# Patient Record
Sex: Male | Born: 1960 | Race: White | Hispanic: No | Marital: Married | Smoking: Former smoker
Health system: Southern US, Community
[De-identification: ages and names within clinical notes are randomized; demographics above are authoritative.]

## PROBLEM LIST (undated history)

## (undated) DIAGNOSIS — F1910 Other psychoactive substance abuse, uncomplicated: Secondary | ICD-10-CM

## (undated) DIAGNOSIS — F419 Anxiety disorder, unspecified: Secondary | ICD-10-CM

## (undated) DIAGNOSIS — K469 Unspecified abdominal hernia without obstruction or gangrene: Secondary | ICD-10-CM

## (undated) DIAGNOSIS — M1990 Unspecified osteoarthritis, unspecified site: Secondary | ICD-10-CM

## (undated) HISTORY — DX: Other psychoactive substance abuse, uncomplicated: F19.10

## (undated) HISTORY — PX: IMPLANTATION BONE ANCHORED HEARING AID: SUR691

## (undated) HISTORY — DX: Unspecified osteoarthritis, unspecified site: M19.90

## (undated) HISTORY — DX: Anxiety disorder, unspecified: F41.9

## (undated) HISTORY — DX: Unspecified abdominal hernia without obstruction or gangrene: K46.9

## (undated) HISTORY — PX: HERNIA REPAIR: SHX51

---

## 1999-06-27 ENCOUNTER — Encounter: Admission: RE | Admit: 1999-06-27 | Discharge: 1999-06-27 | Payer: Self-pay | Admitting: Internal Medicine

## 1999-06-27 ENCOUNTER — Encounter: Payer: Self-pay | Admitting: Internal Medicine

## 2002-12-06 ENCOUNTER — Encounter: Payer: Self-pay | Admitting: Internal Medicine

## 2002-12-06 ENCOUNTER — Ambulatory Visit (HOSPITAL_COMMUNITY): Admission: RE | Admit: 2002-12-06 | Discharge: 2002-12-06 | Payer: Self-pay | Admitting: Internal Medicine

## 2004-01-16 ENCOUNTER — Ambulatory Visit (HOSPITAL_COMMUNITY): Admission: RE | Admit: 2004-01-16 | Discharge: 2004-01-16 | Payer: Self-pay | Admitting: Internal Medicine

## 2005-01-09 ENCOUNTER — Ambulatory Visit (HOSPITAL_COMMUNITY): Admission: RE | Admit: 2005-01-09 | Discharge: 2005-01-09 | Payer: Self-pay | Admitting: Internal Medicine

## 2005-12-27 ENCOUNTER — Encounter: Admission: RE | Admit: 2005-12-27 | Discharge: 2005-12-27 | Payer: Self-pay | Admitting: Otolaryngology

## 2008-03-29 ENCOUNTER — Ambulatory Visit (HOSPITAL_COMMUNITY): Admission: RE | Admit: 2008-03-29 | Discharge: 2008-03-29 | Payer: Self-pay | Admitting: Internal Medicine

## 2010-06-07 ENCOUNTER — Ambulatory Visit (HOSPITAL_COMMUNITY)
Admission: RE | Admit: 2010-06-07 | Discharge: 2010-06-07 | Disposition: A | Discharge status: Home / Self Care | Payer: BC Managed Care – PPO | Source: Ambulatory Visit | Attending: Internal Medicine | Admitting: Internal Medicine

## 2010-06-07 ENCOUNTER — Other Ambulatory Visit (HOSPITAL_COMMUNITY): Payer: Self-pay | Admitting: Internal Medicine

## 2010-06-07 DIAGNOSIS — I10 Essential (primary) hypertension: Secondary | ICD-10-CM | POA: Insufficient documentation

## 2010-06-07 DIAGNOSIS — Z0000 Encounter for general adult medical examination without abnormal findings: Secondary | ICD-10-CM | POA: Insufficient documentation

## 2010-11-12 ENCOUNTER — Encounter (INDEPENDENT_AMBULATORY_CARE_PROVIDER_SITE_OTHER): Payer: Self-pay | Admitting: General Surgery

## 2010-11-12 ENCOUNTER — Ambulatory Visit (INDEPENDENT_AMBULATORY_CARE_PROVIDER_SITE_OTHER): Payer: BC Managed Care – PPO | Admitting: General Surgery

## 2010-11-12 VITALS — BP 134/86 | HR 72 | Temp 96.8°F | Ht 71.0 in | Wt 199.4 lb

## 2010-11-12 DIAGNOSIS — K4090 Unilateral inguinal hernia, without obstruction or gangrene, not specified as recurrent: Secondary | ICD-10-CM

## 2010-11-12 NOTE — Progress Notes (Signed)
Kevin Irwin is a 50 y.o. male.    Chief Complaint  Patient presents with  . Other    new pt- eval RIH    HPI HPI This patient was referred for evaluation of a right groin bulge with associated pain. He has history of a left inguinal hernia repair with mesh performed proximally 15-16 years ago and has done well at this but he reports similar symptoms that he had associated with his left-sided hernia now on his right. He states that he has noticed a bulge with some associated "dull" pain the last 3-4 months although the pain has resolved and is now no longer present. He does have occasional discomfort when lifting heavy objects. 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 He does state that this limits him somewhat with his work. Otherwise he denies any obstructive symptoms states his bowels are normal. Denies any nausea vomiting, hematochezia or melena. He has been referred already by his primary physician for routine screening colonoscopy.  Past Medical History  Diagnosis Date  . Hernia   . Arthritis     Past Surgical History  Procedure Date  . Hernia repair     Family History  Problem Relation Age of Onset  . Cancer Father     Social History History  Substance Use Topics  . Smoking status: Former Games developer  . Smokeless tobacco: Not on file  . Alcohol Use: 0.0 oz/week    7-10 Cans of beer per week    No Known Allergies  Current Outpatient Prescriptions  Medication Sig Dispense Refill  . TESTIM 50 MG/5GM GEL Ad lib.        Review of Systems Review of Systems  Constitutional: Negative.   HENT: Negative.   Eyes: Negative.   Respiratory: Negative.   Cardiovascular: Negative.   Gastrointestinal: Negative.   Genitourinary: Negative.   Musculoskeletal: Negative.   Skin: Negative.   Neurological: Negative.   Endo/Heme/Allergies: Negative.   Psychiatric/Behavioral: Negative.     Physical Exam Physical Exam  Constitutional: He is oriented to  person, place, and time. He appears well-developed and well-nourished. No distress.  HENT:  Head: Normocephalic and atraumatic.  Mouth/Throat: No oropharyngeal exudate.  Eyes: Conjunctivae and EOM are normal. Pupils are equal, round, and reactive to light. Right eye exhibits no discharge. Left eye exhibits no discharge. No scleral icterus.  Neck: Normal range of motion. Neck supple. No tracheal deviation present.  Cardiovascular: Normal rate, regular rhythm, normal heart sounds and intact distal pulses.   Respiratory: Effort normal and breath sounds normal. No stridor. No respiratory distress. He has no wheezes.  GI: Soft. Bowel sounds are normal. He exhibits no distension and no mass. There is no tenderness. There is no rebound and no guarding.  Genitourinary: Penis normal. No penile tenderness.       Prior LIH scar, no evidence of recurrence RIght groin bulge with valsalva consistent with RIH, reducible   Musculoskeletal: Normal range of motion. He exhibits no edema and no tenderness.  Neurological: He is alert and oriented to person, place, and time.  Skin: Skin is warm and dry. No rash noted. He is not diaphoretic. No erythema. No pallor.  Psychiatric: He has a normal mood and affect. His behavior is normal. Judgment and thought content normal.     Blood pressure 134/86, pulse 72, temperature 96.8 F (36 C), height 5\' 11"  (1.803 m), weight 199 lb 6.4 oz (90.447 kg).  Assessment/Plan Reducible right inguinal hernia  He  does have a reducible right inguinal hernia on exam and by history. I discussed with him the options of open and laparoscopic repair versus watchful waiting and he would like to have this repaired. Given the fact that he has had an open left inguinal hernia already I think that open right inguinal hernia repair would be appropriate. If he has recurrence of either side in the future we will reserve the laparoscopic route. I discussed with him the risks of the procedure  including infection, bleeding, pain, chronic pain and nerve injury, numbness, injury to the vas deferens or loss of testicle, and recurrence and he expressed understanding and desires to proceed with open right inguinal hernia repair with mesh.  Lodema Pilot DAVID 11/12/2010, 10:04 AM

## 2010-11-22 ENCOUNTER — Encounter (HOSPITAL_COMMUNITY)
Admission: RE | Admit: 2010-11-22 | Discharge: 2010-11-22 | Disposition: A | Discharge status: Home / Self Care | Payer: BC Managed Care – PPO | Source: Ambulatory Visit | Attending: General Surgery | Admitting: General Surgery

## 2010-11-22 LAB — BASIC METABOLIC PANEL
BUN: 12 mg/dL (ref 6–23)
Chloride: 104 mEq/L (ref 96–112)
Glucose, Bld: 86 mg/dL (ref 70–99)
Potassium: 4.2 mEq/L (ref 3.5–5.1)

## 2010-11-22 LAB — CBC
HCT: 42.5 % (ref 39.0–52.0)
Hemoglobin: 14.6 g/dL (ref 13.0–17.0)
MCHC: 34.4 g/dL (ref 30.0–36.0)
MCV: 87.3 fL (ref 78.0–100.0)
WBC: 7.8 10*3/uL (ref 4.0–10.5)

## 2010-12-03 ENCOUNTER — Ambulatory Visit (HOSPITAL_COMMUNITY)
Admission: RE | Admit: 2010-12-03 | Discharge: 2010-12-03 | Disposition: A | Discharge status: Home / Self Care | Payer: BC Managed Care – PPO | Source: Ambulatory Visit | Attending: General Surgery | Admitting: General Surgery

## 2010-12-03 DIAGNOSIS — K4090 Unilateral inguinal hernia, without obstruction or gangrene, not specified as recurrent: Secondary | ICD-10-CM

## 2010-12-03 DIAGNOSIS — Z01812 Encounter for preprocedural laboratory examination: Secondary | ICD-10-CM | POA: Insufficient documentation

## 2010-12-18 ENCOUNTER — Ambulatory Visit (INDEPENDENT_AMBULATORY_CARE_PROVIDER_SITE_OTHER): Payer: BC Managed Care – PPO | Admitting: General Surgery

## 2010-12-18 VITALS — BP 126/82 | HR 66

## 2010-12-18 DIAGNOSIS — Z4889 Encounter for other specified surgical aftercare: Secondary | ICD-10-CM

## 2010-12-18 DIAGNOSIS — Z5189 Encounter for other specified aftercare: Secondary | ICD-10-CM

## 2010-12-18 NOTE — Progress Notes (Signed)
Subjective:     Patient ID: Kevin Irwin, male   DOB: 11-01-1960, 50 y.o.   MRN: [id number]  HPI This patient is 2 weeks status post open right inguinal hernia repair with mesh. He states he is doing well as the longer taking pain medication. He is tolerating regular diet and his bowels are functioning well now after some initial constipation. He has occasional twinge in the right groin but otherwise pain improved.  Review of Systems     Objective:   Physical Exam No acute distress and nontoxic-appearing  His incision is healing well without sign of infection. He has a healing ridge but no evidence of recurrence, no bulge with Valsalva. Scrotum and testicles are normal.    Assessment:     Status post open right ankle hernia repair with mesh-overall doing well    Plan:     He seems to be doing well and appropriate for this stage postop. I recommended another 2 weeks of light duty before he returned to full activity. He can shower as normal and in another 2 weeks he can increase his activity as tolerated. If he continues to have discomfort in the region in another 2-3 months then I recommended that he give me a call for further evaluation.

## 2010-12-25 NOTE — Op Note (Signed)
NAME:  Kevin Irwin, Kevin Irwin NO.:  [id number]  MEDICAL RECORD NO.:  [id number]  LOCATION:  [facility]                         FACILITY:  [facility]  PHYSICIAN:  Lodema Pilot, MD       DATE OF BIRTH:  [birth year]  DATE OF PROCEDURE:  12/03/2010 DATE OF DISCHARGE:                              OPERATIVE REPORT   PROCEDURE:  Open right inguinal hernia repair with mesh.  PREOPERATIVE DIAGNOSIS:  Right inguinal hernia.  POSTOPERATIVE DIAGNOSIS:  Right inguinal hernia.  SURGEON:  Lodema Pilot, MD  ASSISTANT:  Anselm Pancoast. Zachery Dakins, MD  ANESTHESIA:  General LMA anesthesia with 30 mL of 1% lidocaine with epinephrine and 0.25% Marcaine in a 50:50 mixture.  FLUIDS:  Crystalloid 500 mL.  ESTIMATED BLOOD LOSS:  Minimal.  DRAINS:  None.  SPECIMENS:  None.  COMPLICATIONS:  None apparent.  FINDINGS:  Indirect and direct inguinal hernia as well as a cord lipoma, and a 3 inch x 6 inch UltraPro mesh placed in the inguinal canal.  INDICATIONS FOR PROCEDURE:  Kevin Irwin is a 50 year old male with a history of left inguinal hernia repair who presents with a new symptomatic right inguinal hernia on exam and desires repair.  OPERATIVE DETAILS:  Kevin Irwin was seen and evaluated in the preoperative area and risks and benefits of the procedure were again discussed in lay terms.  Informed consent was obtained.  The patient was examined and noted to have a reducible right inguinal hernia, and no left inguinal hernia was found on exam.  Prophylactic antibiotics were given.  He was taken to the operating room, placed on table in supine position.  His scrotum and abdomen were prepped and draped in standard surgical fashion.  An oblique incision was made in the skin and dissection carried down to subcutaneous tissue using Bovie electrocautery and the external oblique fascia was sharply incised along the length of the fibers.  The spermatic cord was immediately visualized and the ilioinguinal  nerve was identified as well coursing along the anterior surface of the cord structures.  The small direct hernia was noted and this was easily reduced back into the abdominal cavity and the floor was imbricated with a 2-0 Vicryl suture.  The cord was then skeletonized and he was noted to have cord lipoma laterally to the cord and this was taken down to its origin and ligation performed using a 2-0 Vicryl and stick tie.  The cord was further skeletonized and he was noted to have an indirect inguinal hernia as well, but appeared to only contain fatty contents, this was very small and reduced easily back into the peritoneum.  The internal ring was tightened using figure-of-eight 2- 0 Vicryl suture and the inguinal canal was inspected for hemostasis which was noted be adequate.  An 3 inch x 6 inch UltraPro mesh was tailored to fit the inguinal canal and sutured in place at the pubic tubercle and a 2-0 Prolene suture was used to run the inferior edge of the mesh and attachment to the shelving edge of the inguinal ligament. This was placed in the lateral aspect of the mesh and the tail of the mesh were passed around the cord  and was sutured in place medially and cephalad and laterally with interrupted 2-0 Prolene sutures, taking care to avoid injury to visible nervous structures.  The ilioinguinal nerve was preserved during the dissection and maintained with the cord structures.  The mesh was sutured in place and appeared to cover both indirect and direct hernia defects and the wound was then irrigated with sterile saline solution and the wound was noted be hemostatic.  The fascia was injected with a total of 30 mL of 1% lidocaine with epinephrine and 0.25% Marcaine in a 50:50 mixture.  Again, the wound was noted to be hemostatic.  The external oblique fascia was approximated with 2-0 Vicryl running suture to recreate the external ring, and Scarpa fascia was approximated with a 3-0 Vicryl  running suture, and the skin edge were approximated with 4-0 Monocryl subcuticular suture.  Skin was washed and dried and Dermabond was applied.  All sponge, needle, and instrument counts were correct at the end of the case.  The patient tolerated the procedure well without apparent complication.          ______________________________ Lodema Pilot, MD     BL/MEDQ  D:  12/03/2010  T:  12/04/2010  Job:  [id number]  Electronically Signed by Lodema Pilot DO on 12/25/2010 03:55:08 PM

## 2012-12-31 ENCOUNTER — Other Ambulatory Visit: Payer: Self-pay | Admitting: Sports Medicine

## 2012-12-31 DIAGNOSIS — IMO0002 Reserved for concepts with insufficient information to code with codable children: Secondary | ICD-10-CM

## 2013-01-01 ENCOUNTER — Ambulatory Visit
Admission: RE | Admit: 2013-01-01 | Discharge: 2013-01-01 | Disposition: A | Discharge status: Home / Self Care | Payer: BC Managed Care – PPO | Source: Ambulatory Visit | Attending: Sports Medicine | Admitting: Sports Medicine

## 2013-01-01 DIAGNOSIS — IMO0002 Reserved for concepts with insufficient information to code with codable children: Secondary | ICD-10-CM

## 2013-05-14 ENCOUNTER — Encounter (HOSPITAL_COMMUNITY)
Admission: AD | Disposition: A | Discharge status: Home / Self Care | Payer: Self-pay | Source: Ambulatory Visit | Attending: Gastroenterology

## 2013-05-14 ENCOUNTER — Ambulatory Visit (HOSPITAL_COMMUNITY)
Admission: AD | Admit: 2013-05-14 | Discharge: 2013-05-14 | Disposition: A | Discharge status: Home / Self Care | Payer: BC Managed Care – PPO | Source: Ambulatory Visit | Attending: Gastroenterology | Admitting: Gastroenterology

## 2013-05-14 ENCOUNTER — Other Ambulatory Visit: Payer: Self-pay | Admitting: Gastroenterology

## 2013-05-14 ENCOUNTER — Encounter (HOSPITAL_COMMUNITY): Payer: Self-pay

## 2013-05-14 DIAGNOSIS — T18108A Unspecified foreign body in esophagus causing other injury, initial encounter: Secondary | ICD-10-CM | POA: Insufficient documentation

## 2013-05-14 DIAGNOSIS — IMO0002 Reserved for concepts with insufficient information to code with codable children: Secondary | ICD-10-CM | POA: Insufficient documentation

## 2013-05-14 HISTORY — PX: ESOPHAGOGASTRODUODENOSCOPY: SHX5428

## 2013-05-14 SURGERY — EGD (ESOPHAGOGASTRODUODENOSCOPY)
Anesthesia: Moderate Sedation

## 2013-05-14 MED ORDER — SODIUM CHLORIDE 0.9 % IV SOLN
INTRAVENOUS | Status: DC
  Administered 2013-05-14: 500 mL via INTRAVENOUS

## 2013-05-14 MED ORDER — DIPHENHYDRAMINE HCL 50 MG/ML IJ SOLN
INTRAMUSCULAR | Status: AC
  Filled 2013-05-14: qty 1

## 2013-05-14 MED ORDER — FENTANYL CITRATE 0.05 MG/ML IJ SOLN
INTRAMUSCULAR | Status: AC
  Filled 2013-05-14: qty 2

## 2013-05-14 MED ORDER — FENTANYL CITRATE 0.05 MG/ML IJ SOLN
INTRAMUSCULAR | Status: DC | PRN
  Administered 2013-05-14 (×3): 25 ug via INTRAVENOUS

## 2013-05-14 MED ORDER — MIDAZOLAM HCL 5 MG/ML IJ SOLN
INTRAMUSCULAR | Status: AC
  Filled 2013-05-14: qty 2

## 2013-05-14 MED ORDER — BUTAMBEN-TETRACAINE-BENZOCAINE 2-2-14 % EX AERO
INHALATION_SPRAY | CUTANEOUS | Status: DC | PRN
  Administered 2013-05-14: 2 via TOPICAL

## 2013-05-14 MED ORDER — MIDAZOLAM HCL 10 MG/2ML IJ SOLN
INTRAMUSCULAR | Status: DC | PRN
  Administered 2013-05-14: 1 mg via INTRAVENOUS
  Administered 2013-05-14 (×2): 2 mg via INTRAVENOUS
  Administered 2013-05-14: 1 mg via INTRAVENOUS

## 2013-05-14 NOTE — Discharge Instructions (Signed)
Liquids only today and if doing well may have soft solids tomorrow and slowly advance diet and chew food well and eat slowly and drink plenty of liquids while eating and use over-the-counter Prilosec every day and call if question or problem or continue in swallowing issues otherwise followup with myself or Dr. Madilyn FiremanHayes in 2-3 weeks to discuss possible dilation and probable screening colonoscopy

## 2013-05-14 NOTE — Op Note (Signed)
[facility] [address] [place], [id number]   ENDOSCOPY PROCEDURE REPORT  PATIENT: Kevin Irwin, [name]  MR#: [id number] BIRTHDATE: [birth year] , 52  yrs. old GENDER: Male  ENDOSCOPIST: Vida RiggerMarc Robbie Nangle, MD REFERRED BY:  PROCEDURE DATE:  05/14/2013 PROCEDURE:   EGD w/ fb removal ASA CLASS:   Class II INDICATIONS:Foreign body removal.  MEDICATIONS: Fentanyl 75 mcg IV and Versed 6 mg IV  TOPICAL ANESTHETIC:used  DESCRIPTION OF PROCEDURE:   After the risks benefits and alternatives of the procedure were thoroughly explained, informed consent was obtained.  The Pentax Gastroscope [id number]  endoscope was introduced through the mouth and advanced to thedistal esophagus were obvious food was seen and we advanced the snare and it cut through the food x2 in the food well in the stomach and then the scope was advanced to the second portion of the duodenum , limited by Without limitations.   The instrument was slowly withdrawn as the mucosa was fully examined.the findings are recorded below and the patient tolerated the procedure well there was no obvious immediate complication         FINDINGS:#1 obvious food bolus status post cut with snare x2 and fell into stomach 2. Small hiatal hernia with widely patent fibrous ring 3 otherwise within normal limits EGD  COMPLICATIONS: no  ENDOSCOPIC IMPRESSION:above   RECOMMENDATIONS:liquids only today pump inhibitors OTC Prilosec will be okay and followup in the office in 2-3 weeks to discuss possible dilation and probable screening colonoscopy and call sooner when necessaryand chew food well and take his time and drink plenty of fluids   REPEAT EXAM: as needed   _______________________________ Vida RiggerMarc Hurshell Dino, MD eSigned:  Vida RiggerMarc Pierrette Scheu, MD 05/14/2013 4:39 PM    CC:  PATIENT NAME:  Kevin Irwin, Treyven MR#: [id number]

## 2013-05-14 NOTE — Addendum Note (Signed)
Addended byVida Rigger: Kirstyn Lean on: 05/14/2013 03:14 PM   Modules accepted: Orders

## 2013-05-17 ENCOUNTER — Encounter (HOSPITAL_COMMUNITY): Payer: Self-pay | Admitting: Gastroenterology

## 2014-07-04 ENCOUNTER — Ambulatory Visit (INDEPENDENT_AMBULATORY_CARE_PROVIDER_SITE_OTHER): Payer: BLUE CROSS/BLUE SHIELD | Admitting: Physician Assistant

## 2014-07-04 VITALS — BP 118/76 | HR 89 | Temp 97.8°F | Resp 12 | Ht 69.75 in | Wt 202.0 lb

## 2014-07-04 DIAGNOSIS — F1020 Alcohol dependence, uncomplicated: Secondary | ICD-10-CM

## 2014-07-04 NOTE — Progress Notes (Addendum)
Subjective:    Patient ID: Kevin Irwin, male    DOB: [birth year], 54 y.o.   MRN: [id number]  Chief Complaint  Patient presents with  . alcoholism    would like to talk about    There are no active problems to display for this patient.  Prior to Admission medications   Not on File   Medications, allergies, past medical history, surgical history, family history, social history and problem list reviewed and updated.  HPI  39 yom with no significant known pmh presents wanting to discuss alcohol cessation options.  States he started drinking in teens. Felt that he had a prob by 54 years old. Quit drinking in early 30s for few yrs cold [place]. Started back up as he was bored with not drinking. Three DUIs in his 1s. Now mostly drinks at home. Two domestic violence charges. 1st 18 yrs ago, 2nd 6 yrs ago.   Still legally married but has been living alone for several yrs. Has two kids living with their mom. Works full time as Teaching laboratory technician. Does not drinks before work or on the job.   Drinks aprrox 7-8 beers day. 8% alcohol. No liquor, wine.   Quit smoking 20 yrs ago cold [place].   Drinks to attain the feeling of creativity. Does not feel it otherwise. Does not know when to stop.   On zoloft many yrs ago for depression. Didn't like it. Doesn't think he is anxious or depressed. Just gets bored and desires the creative adge that alcohol gives him.   Review of Systems No cp, sob, fever, chills.     Objective:   Physical Exam  Constitutional: He is oriented to person, place, and time. He appears well-developed and well-nourished.  Non-toxic appearance. He does not have a sickly appearance. He does not appear ill. No distress.  BP 118/76 mmHg  Pulse 89  Temp(Src) 97.8 F (36.6 C) (Oral)  Resp 12  Ht 5' 9.75" (1.772 m)  Wt 202 lb (91.627 kg)  BMI 29.18 kg/m2  SpO2 99%   Eyes: Conjunctivae and EOM are normal. Pupils are equal, round, and reactive to light. Right eye  exhibits no nystagmus. Left eye exhibits no nystagmus.  Cardiovascular: Normal rate, regular rhythm and normal heart sounds.   Abdominal: Soft. Normal appearance and bowel sounds are normal. He exhibits no pulsatile liver, no fluid wave and no ascites. There is no hepatosplenomegaly. There is no tenderness.  No spider angiomas.   Neurological: He is alert and oriented to person, place, and time.  No asterixis.   Skin: Skin is warm and dry. He is not diaphoretic.  No palmar erythema.   Psychiatric: He has a normal mood and affect. His speech is normal and behavior is normal.      Assessment & Plan:   34 yom with no significant known pmh presents wanting to discuss alcohol cessation options.  Alcoholism - Plan: COMPLETE METABOLIC PANEL WITH GFR, CBC, Gamma GT, Ambulatory referral to Psychology --referral to psychology, pt on board with this initial step --possible medication in future to assist with cessation pending psych eval --cbc, cmp, ggt --no concerning physical exam signs  Donnajean Lopes, PA-C Physician Assistant-Certified [facility]  07/05/2014 9:01 PM  Greater than 45 minutes spent with pt, discussing his current medical condition and possible treatment options for this.  I have participated in the care of this patient with the Advanced Practice Provider and agree with Diagnosis and  Plan as documented. He is not your typical alcoholic-medication such as vivtrol may help him stop alcohol for little while but will not address the emptiness and lack of energy and lack of  creativity without alcohol. This suggest an underlying dysthymia and social anxiety and if his psychological evaluation substantiates this we will proceed with medication. Robert P. Merla Richesoolittle, M.D.

## 2014-07-04 NOTE — Patient Instructions (Signed)
Thank you for coming in to see us today.  We have referred you to see a psychology group. They will be in contact with you to schedule an appointment. If you don't hear anything from them in 12 weeks please let us know. You can also call them to schedule your own appointment as well. If they feel that a medication is best for you, they will likely have you come back to see us. We drew labs today to check you liver and blood cells, I'll be in touch with you with those results.

## 2014-07-05 LAB — CBC
HCT: 44.5 % (ref 39.0–52.0)
HEMOGLOBIN: 15.1 g/dL (ref 13.0–17.0)
MCH: 31 pg (ref 26.0–34.0)
MCHC: 33.9 g/dL (ref 30.0–36.0)
MCV: 91.4 fL (ref 78.0–100.0)
MPV: 9.4 fL (ref 8.6–12.4)
PLATELETS: 249 10*3/uL (ref 150–400)
RBC: 4.87 MIL/uL (ref 4.22–5.81)
RDW: 14.5 % (ref 11.5–15.5)
WBC: 4.9 10*3/uL (ref 4.0–10.5)

## 2014-07-05 LAB — COMPLETE METABOLIC PANEL WITH GFR
ALK PHOS: 40 U/L (ref 39–117)
ALT: 23 U/L (ref 0–53)
AST: 23 U/L (ref 0–37)
Albumin: 4.6 g/dL (ref 3.5–5.2)
BILIRUBIN TOTAL: 0.5 mg/dL (ref 0.2–1.2)
BUN: 14 mg/dL (ref 6–23)
CO2: 28 meq/L (ref 19–32)
CREATININE: 0.87 mg/dL (ref 0.50–1.35)
Calcium: 8.9 mg/dL (ref 8.4–10.5)
Chloride: 103 mEq/L (ref 96–112)
GLUCOSE: 101 mg/dL — AB (ref 70–99)
Potassium: 4.4 mEq/L (ref 3.5–5.3)
SODIUM: 139 meq/L (ref 135–145)
Total Protein: 7.1 g/dL (ref 6.0–8.3)

## 2014-07-05 LAB — GAMMA GT: GGT: 36 U/L (ref 7–51)

## 2015-02-20 ENCOUNTER — Other Ambulatory Visit: Payer: Self-pay | Admitting: Occupational Medicine

## 2015-02-20 ENCOUNTER — Ambulatory Visit: Payer: Self-pay

## 2015-02-20 DIAGNOSIS — M25572 Pain in left ankle and joints of left foot: Secondary | ICD-10-CM

## 2015-06-09 ENCOUNTER — Other Ambulatory Visit: Payer: Self-pay | Admitting: Otolaryngology

## 2015-06-09 DIAGNOSIS — H9193 Unspecified hearing loss, bilateral: Secondary | ICD-10-CM

## 2015-06-09 DIAGNOSIS — H9312 Tinnitus, left ear: Secondary | ICD-10-CM

## 2015-06-18 ENCOUNTER — Other Ambulatory Visit: Payer: Self-pay

## 2015-06-20 ENCOUNTER — Other Ambulatory Visit: Payer: Self-pay

## 2015-08-16 ENCOUNTER — Encounter (HOSPITAL_COMMUNITY)
Admission: EM | Disposition: A | Discharge status: Home / Self Care | Payer: Self-pay | Source: Home / Self Care | Attending: Emergency Medicine

## 2015-08-16 ENCOUNTER — Emergency Department (HOSPITAL_COMMUNITY): Payer: Worker's Compensation | Admitting: Certified Registered Nurse Anesthetist

## 2015-08-16 ENCOUNTER — Encounter (HOSPITAL_COMMUNITY): Payer: Self-pay | Admitting: Vascular Surgery

## 2015-08-16 ENCOUNTER — Emergency Department (HOSPITAL_COMMUNITY): Payer: Worker's Compensation

## 2015-08-16 ENCOUNTER — Ambulatory Visit (HOSPITAL_COMMUNITY)
Admission: EM | Admit: 2015-08-16 | Discharge: 2015-08-16 | Disposition: A | Discharge status: Home / Self Care | Payer: Worker's Compensation | Attending: Emergency Medicine | Admitting: Emergency Medicine

## 2015-08-16 DIAGNOSIS — S62632B Displaced fracture of distal phalanx of right middle finger, initial encounter for open fracture: Secondary | ICD-10-CM | POA: Insufficient documentation

## 2015-08-16 DIAGNOSIS — S62636B Displaced fracture of distal phalanx of right little finger, initial encounter for open fracture: Secondary | ICD-10-CM | POA: Diagnosis present

## 2015-08-16 DIAGNOSIS — Z87891 Personal history of nicotine dependence: Secondary | ICD-10-CM | POA: Insufficient documentation

## 2015-08-16 DIAGNOSIS — Z23 Encounter for immunization: Secondary | ICD-10-CM | POA: Insufficient documentation

## 2015-08-16 DIAGNOSIS — W3189XA Contact with other specified machinery, initial encounter: Secondary | ICD-10-CM | POA: Insufficient documentation

## 2015-08-16 DIAGNOSIS — IMO0001 Reserved for inherently not codable concepts without codable children: Secondary | ICD-10-CM

## 2015-08-16 DIAGNOSIS — S62634B Displaced fracture of distal phalanx of right ring finger, initial encounter for open fracture: Secondary | ICD-10-CM | POA: Insufficient documentation

## 2015-08-16 HISTORY — PX: NERVE, TENDON AND ARTERY REPAIR: SHX5695

## 2015-08-16 LAB — CBC WITH DIFFERENTIAL/PLATELET
BASOS ABS: 0 10*3/uL (ref 0.0–0.1)
Basophils Relative: 0 %
Eosinophils Absolute: 0 10*3/uL (ref 0.0–0.7)
Eosinophils Relative: 0 %
HCT: 43.9 % (ref 39.0–52.0)
HEMOGLOBIN: 14.9 g/dL (ref 13.0–17.0)
LYMPHS ABS: 0.9 10*3/uL (ref 0.7–4.0)
LYMPHS PCT: 9 %
MCH: 30.7 pg (ref 26.0–34.0)
MCHC: 33.9 g/dL (ref 30.0–36.0)
MCV: 90.3 fL (ref 78.0–100.0)
Monocytes Absolute: 0.6 10*3/uL (ref 0.1–1.0)
Monocytes Relative: 6 %
NEUTROS ABS: 9.2 10*3/uL — AB (ref 1.7–7.7)
NEUTROS PCT: 85 %
PLATELETS: 288 10*3/uL (ref 150–400)
RBC: 4.86 MIL/uL (ref 4.22–5.81)
RDW: 13 % (ref 11.5–15.5)
WBC: 10.8 10*3/uL — AB (ref 4.0–10.5)

## 2015-08-16 LAB — BASIC METABOLIC PANEL
ANION GAP: 11 (ref 5–15)
BUN: 8 mg/dL (ref 6–20)
CHLORIDE: 103 mmol/L (ref 101–111)
CO2: 23 mmol/L (ref 22–32)
Calcium: 9.8 mg/dL (ref 8.9–10.3)
Creatinine, Ser: 0.97 mg/dL (ref 0.61–1.24)
GFR calc Af Amer: >60 mL/min (ref >60)
GLUCOSE: 112 mg/dL — AB (ref 65–99)
POTASSIUM: 5.1 mmol/L (ref 3.5–5.1)
Sodium: 137 mmol/L (ref 135–145)

## 2015-08-16 SURGERY — NERVE, TENDON AND ARTERY REPAIR
Anesthesia: General | Laterality: Right

## 2015-08-16 MED ORDER — PROPOFOL 10 MG/ML IV BOLUS
INTRAVENOUS | Status: DC | PRN
  Administered 2015-08-16: 200 mg via INTRAVENOUS

## 2015-08-16 MED ORDER — LACTATED RINGERS IV SOLN
INTRAVENOUS | Status: DC
  Administered 2015-08-16 (×2): via INTRAVENOUS

## 2015-08-16 MED ORDER — LIDOCAINE HCL (CARDIAC) 20 MG/ML IV SOLN
INTRAVENOUS | Status: DC | PRN
  Administered 2015-08-16: 60 mg via INTRAVENOUS

## 2015-08-16 MED ORDER — OXYCODONE HCL 5 MG PO TABS: 5.0000 mg | ORAL_TABLET | Freq: Once | ORAL | Status: DC | PRN

## 2015-08-16 MED ORDER — BUPIVACAINE HCL (PF) 0.5 % IJ SOLN
20.0000 mL | Freq: Once | INTRAMUSCULAR | Status: AC
  Administered 2015-08-16: 20 mL
  Filled 2015-08-16: qty 20

## 2015-08-16 MED ORDER — SODIUM CHLORIDE 0.9 % IV SOLN
INTRAVENOUS | Status: DC
Start: 2015-08-16 — End: 2015-08-16
  Administered 2015-08-16: 15:00:00 via INTRAVENOUS

## 2015-08-16 MED ORDER — HYDROMORPHONE HCL 1 MG/ML IJ SOLN: 0.2500 mg | INTRAMUSCULAR | Status: DC | PRN

## 2015-08-16 MED ORDER — CEFAZOLIN SODIUM 1-5 GM-% IV SOLN
1.0000 g | Freq: Once | INTRAVENOUS | Status: AC
  Administered 2015-08-16: 1 g via INTRAVENOUS
  Filled 2015-08-16: qty 50

## 2015-08-16 MED ORDER — MIDAZOLAM HCL 2 MG/2ML IJ SOLN
INTRAMUSCULAR | Status: AC
  Filled 2015-08-16: qty 2

## 2015-08-16 MED ORDER — CHLORHEXIDINE GLUCONATE 4 % EX LIQD: 60.0000 mL | Freq: Once | CUTANEOUS | Status: DC

## 2015-08-16 MED ORDER — MIDAZOLAM HCL 5 MG/5ML IJ SOLN
INTRAMUSCULAR | Status: DC | PRN
  Administered 2015-08-16: 2 mg via INTRAVENOUS

## 2015-08-16 MED ORDER — ACETAMINOPHEN 160 MG/5ML PO SOLN
325.0000 mg | ORAL | Status: DC | PRN
  Filled 2015-08-16: qty 20.3

## 2015-08-16 MED ORDER — CEPHALEXIN 500 MG PO CAPS: 500.0000 mg | ORAL_CAPSULE | Freq: Four times a day (QID) | ORAL | Status: AC

## 2015-08-16 MED ORDER — 0.9 % SODIUM CHLORIDE (POUR BTL) OPTIME
TOPICAL | Status: DC | PRN
  Administered 2015-08-16: 1000 mL

## 2015-08-16 MED ORDER — BUPIVACAINE HCL (PF) 0.25 % IJ SOLN
INTRAMUSCULAR | Status: DC | PRN
  Administered 2015-08-16: 8 mL

## 2015-08-16 MED ORDER — DOCUSATE SODIUM 100 MG PO CAPS: 100.0000 mg | ORAL_CAPSULE | Freq: Two times a day (BID) | ORAL | Status: AC

## 2015-08-16 MED ORDER — ONDANSETRON HCL 4 MG/2ML IJ SOLN
INTRAMUSCULAR | Status: DC | PRN
  Administered 2015-08-16: 4 mg via INTRAVENOUS

## 2015-08-16 MED ORDER — ACETAMINOPHEN 325 MG PO TABS: 325.0000 mg | ORAL_TABLET | ORAL | Status: DC | PRN

## 2015-08-16 MED ORDER — OXYCODONE-ACETAMINOPHEN 5-325 MG PO TABS: 1.0000 | ORAL_TABLET | ORAL | Status: AC | PRN

## 2015-08-16 MED ORDER — TETANUS-DIPHTH-ACELL PERTUSSIS 5-2.5-18.5 LF-MCG/0.5 IM SUSP
0.5000 mL | Freq: Once | INTRAMUSCULAR | Status: AC
  Administered 2015-08-16: 0.5 mL via INTRAMUSCULAR
  Filled 2015-08-16: qty 0.5

## 2015-08-16 MED ORDER — FENTANYL CITRATE (PF) 100 MCG/2ML IJ SOLN
INTRAMUSCULAR | Status: DC | PRN
  Administered 2015-08-16: 50 ug via INTRAVENOUS
  Administered 2015-08-16 (×2): 100 ug via INTRAVENOUS

## 2015-08-16 MED ORDER — LIDOCAINE HCL 2 % IJ SOLN
20.0000 mL | Freq: Once | INTRAMUSCULAR | Status: AC
Start: 2015-08-16 — End: 2015-08-16
  Administered 2015-08-16: 400 mg via INTRADERMAL
  Filled 2015-08-16: qty 20

## 2015-08-16 MED ORDER — OXYCODONE HCL 5 MG/5ML PO SOLN: 5.0000 mg | Freq: Once | ORAL | Status: DC | PRN

## 2015-08-16 MED ORDER — SUCCINYLCHOLINE CHLORIDE 20 MG/ML IJ SOLN
INTRAMUSCULAR | Status: DC | PRN
  Administered 2015-08-16: 50 mg via INTRAVENOUS

## 2015-08-16 MED ORDER — FENTANYL CITRATE (PF) 250 MCG/5ML IJ SOLN
INTRAMUSCULAR | Status: AC
  Filled 2015-08-16: qty 5

## 2015-08-16 MED ORDER — CEFAZOLIN SODIUM-DEXTROSE 2-4 GM/100ML-% IV SOLN: 2.0000 g | INTRAVENOUS | Status: DC

## 2015-08-16 MED ORDER — CEFAZOLIN SODIUM-DEXTROSE 2-4 GM/100ML-% IV SOLN
INTRAVENOUS | Status: AC
  Administered 2015-08-16: 2 g via INTRAVENOUS
  Filled 2015-08-16: qty 100

## 2015-08-16 SURGICAL SUPPLY — 52 items
BANDAGE ELASTIC 3 VELCRO ST LF (GAUZE/BANDAGES/DRESSINGS) IMPLANT
BANDAGE ELASTIC 4 VELCRO ST LF (GAUZE/BANDAGES/DRESSINGS) IMPLANT
BNDG CMPR 9X4 STRL LF SNTH (GAUZE/BANDAGES/DRESSINGS) ×1
BNDG COHESIVE 1X5 TAN STRL LF (GAUZE/BANDAGES/DRESSINGS) ×4 IMPLANT
BNDG CONFORM 2 STRL LF (GAUZE/BANDAGES/DRESSINGS) ×4 IMPLANT
BNDG ESMARK 4X9 LF (GAUZE/BANDAGES/DRESSINGS) ×3 IMPLANT
BNDG GAUZE ELAST 4 BULKY (GAUZE/BANDAGES/DRESSINGS) IMPLANT
CORDS BIPOLAR (ELECTRODE) ×5 IMPLANT
COVER SURGICAL LIGHT HANDLE (MISCELLANEOUS) ×3 IMPLANT
CUFF TOURNIQUET SINGLE 18IN (TOURNIQUET CUFF) ×3 IMPLANT
CUFF TOURNIQUET SINGLE 24IN (TOURNIQUET CUFF) IMPLANT
DRAPE OEC MINIVIEW 54X84 (DRAPES) ×2 IMPLANT
DRAPE SURG 17X23 STRL (DRAPES) ×3 IMPLANT
DRSG ADAPTIC 3X8 NADH LF (GAUZE/BANDAGES/DRESSINGS) ×2 IMPLANT
GAUZE SPONGE 2X2 8PLY STRL LF (GAUZE/BANDAGES/DRESSINGS) IMPLANT
GAUZE SPONGE 4X4 12PLY STRL (GAUZE/BANDAGES/DRESSINGS) IMPLANT
GAUZE XEROFORM 1X8 LF (GAUZE/BANDAGES/DRESSINGS) ×2 IMPLANT
GLOVE BIOGEL PI IND STRL 8.5 (GLOVE) ×1 IMPLANT
GLOVE BIOGEL PI INDICATOR 8.5 (GLOVE) ×2
GLOVE SURG ORTHO 8.0 STRL STRW (GLOVE) ×3 IMPLANT
GOWN STRL REUS W/ TWL LRG LVL3 (GOWN DISPOSABLE) ×2 IMPLANT
GOWN STRL REUS W/ TWL XL LVL3 (GOWN DISPOSABLE) ×1 IMPLANT
GOWN STRL REUS W/TWL LRG LVL3 (GOWN DISPOSABLE) ×6
GOWN STRL REUS W/TWL XL LVL3 (GOWN DISPOSABLE) ×3
KIT BASIN OR (CUSTOM PROCEDURE TRAY) ×3 IMPLANT
KIT ROOM TURNOVER OR (KITS) ×3 IMPLANT
KWIRE 1.1 (Wire) ×6 IMPLANT
MANIFOLD NEPTUNE II (INSTRUMENTS) ×3 IMPLANT
NDL HYPO 25GX1X1/2 BEV (NEEDLE) IMPLANT
NEEDLE HYPO 25GX1X1/2 BEV (NEEDLE) IMPLANT
NS IRRIG 1000ML POUR BTL (IV SOLUTION) ×3 IMPLANT
PACK ORTHO EXTREMITY (CUSTOM PROCEDURE TRAY) ×3 IMPLANT
PAD ARMBOARD 7.5X6 YLW CONV (MISCELLANEOUS) ×6 IMPLANT
PAD CAST 4YDX4 CTTN HI CHSV (CAST SUPPLIES) IMPLANT
PADDING CAST COTTON 4X4 STRL (CAST SUPPLIES)
SOAP 2 % CHG 4 OZ (WOUND CARE) ×3 IMPLANT
SPECIMEN JAR SMALL (MISCELLANEOUS) ×3 IMPLANT
SPONGE GAUZE 2X2 STER 10/PKG (GAUZE/BANDAGES/DRESSINGS)
SUCTION FRAZIER HANDLE 10FR (MISCELLANEOUS)
SUCTION TUBE FRAZIER 10FR DISP (MISCELLANEOUS) IMPLANT
SUT ETHIBOND 4 0 TF (SUTURE) ×2 IMPLANT
SUT MERSILENE 4 0 P 3 (SUTURE) IMPLANT
SUT PROLENE 4 0 PS 2 18 (SUTURE) ×8 IMPLANT
SUT VIC AB 2-0 CT1 27 (SUTURE)
SUT VIC AB 2-0 CT1 TAPERPNT 27 (SUTURE) IMPLANT
SYR CONTROL 10ML LL (SYRINGE) IMPLANT
TOWEL OR 17X24 6PK STRL BLUE (TOWEL DISPOSABLE) ×3 IMPLANT
TOWEL OR 17X26 10 PK STRL BLUE (TOWEL DISPOSABLE) ×3 IMPLANT
TUBE CONNECTING 12'X1/4 (SUCTIONS)
TUBE CONNECTING 12X1/4 (SUCTIONS) IMPLANT
UNDERPAD 30X30 INCONTINENT (UNDERPADS AND DIAPERS) ×3 IMPLANT
WATER STERILE IRR 1000ML POUR (IV SOLUTION) ×1 IMPLANT

## 2015-08-16 NOTE — ED Notes (Signed)
Had BAHA done on 3/21.

## 2015-08-16 NOTE — H&P (Signed)
Kevin Irwin is an 55 y.o. male.   Chief Complaint: RIGHT LONG/RING AND SMALL FINGER INJURIES HPI: Kevin Irwin is a 55 y.o. right-handed male who presents to the Emergency Department complaining of a right hand injury that occurred earlier today. Patient states his right 3rd, 4th, and 5th digits were crushed in valve machinery. He rinsed the affected area with saline solution before going to an Urgent Care; he was sent to the ED for further evaluation. He denies any other injuries at this time.   Past Medical History  Diagnosis Date  . Hernia   . Arthritis   . Anxiety   . Substance abuse     Past Surgical History  Procedure Laterality Date  . Hernia repair      twice  . Esophagogastroduodenoscopy N/A 05/14/2013    Procedure: ESOPHAGOGASTRODUODENOSCOPY (EGD);  Surgeon: Jeryl Columbia, MD;  Location: [facility] END[facility]COPY;  Service: Endoscopy;  Laterality: N/A;  . Implantation bone anchored hearing aid      Family History  Problem Relation Age of Onset  . Cancer Father    Social History:  reports that he quit smoking about 22 years ago. He does not have any smokeless tobacco history on file. He reports that he drinks alcohol. He reports that he does not use illicit drugs.  Allergies: No Known Allergies  Medications Prior to Admission  Medication Sig Dispense Refill  . ibuprofen (ADVIL,MOTRIN) 200 MG tablet Take 400 mg by mouth every 6 (six) hours as needed for mild pain.      Results for orders placed or performed during the hospital encounter of 08/16/15 (from the past 48 hour(s))  CBC with Differential/Platelet     Status: Abnormal   Collection Time: 08/16/15  2:07 PM  Result Value Ref Range   WBC 10.8 (H) 4.0 - 10.5 K/uL   RBC 4.86 4.22 - 5.81 MIL/uL   Hemoglobin 14.9 13.0 - 17.0 g/dL   HCT 43.9 39.0 - 52.0 %   MCV 90.3 78.0 - 100.0 fL   MCH 30.7 26.0 - 34.0 pg   MCHC 33.9 30.0 - 36.0 g/dL   RDW 13.0 11.5 - 15.5 %   Platelets 288 150 - 400 K/uL   Neutrophils Relative % 85 %    Neutro Abs 9.2 (H) 1.7 - 7.7 K/uL   Lymphocytes Relative 9 %   Lymphs Abs 0.9 0.7 - 4.0 K/uL   Monocytes Relative 6 %   Monocytes Absolute 0.6 0.1 - 1.0 K/uL   Eosinophils Relative 0 %   Eosinophils Absolute 0.0 0.0 - 0.7 K/uL   Basophils Relative 0 %   Basophils Absolute 0.0 0.0 - 0.1 K/uL  Basic metabolic panel     Status: Abnormal   Collection Time: 08/16/15  2:07 PM  Result Value Ref Range   Sodium 137 135 - 145 mmol/L   Potassium 5.1 3.5 - 5.1 mmol/L   Chloride 103 101 - 111 mmol/L   CO2 23 22 - 32 mmol/L   Glucose, Bld 112 (H) 65 - 99 mg/dL   BUN 8 6 - 20 mg/dL   Creatinine, Ser 0.97 0.61 - 1.24 mg/dL   Calcium 9.8 8.9 - 10.3 mg/dL   GFR calc non Af Amer >60 >60 mL/min   GFR calc Af Amer >60 >60 mL/min    Comment: (NOTE) The eGFR has been calculated using the CKD EPI equation. This calculation has not been validated in all clinical situations. eGFR's persistently <60 mL/min signify possible Chronic Kidney Disease.  Anion gap 11 5 - 15   Dg Hand Complete Right  08/16/2015  CLINICAL DATA:  Injury to the third, fourth and fifth fingers at work. Pain. Swelling. EXAM: RIGHT HAND - COMPLETE 3+ VIEW COMPARISON:  None. FINDINGS: Injury to the third finger involves the distal phalanx. There is an oblique displaced fracture with laceration of the soft tissues. Injury to the fourth finger appears to involve both the distal phalanx proximally and the middle phalanx distally at the DIP joint. These fractures are essentially nondisplaced. Soft tissue laceration is present. Injury to the fifth finger consists of fractures of the distal middle phalanx and proximal distal phalanx, centered at the DIP joint. Soft tissue laceration is also present. Proximal phalanges are intact throughout. The thumb and index finger are intact. No metacarpal injury. No visible radiopaque foreign body. IMPRESSION: Third, fourth and fifth digit injuries as described. Electronically Signed   By: Staci Righter M.D.    On: 08/16/2015 11:55    ROSNO RECENT ILLNESSES OR HOSPITALIZATIONS  Blood pressure 159/93, pulse 99, temperature 98 F (36.7 C), temperature source Oral, resp. rate 16, SpO2 99 %. Physical Exam  General Appearance:  Alert, cooperative, no distress, appears stated age  Head:  Normocephalic, without obvious abnormality, atraumatic  Eyes:  Pupils equal, conjunctiva/corneas clear,         Throat: Lips, mucosa, and tongue normal; teeth and gums normal  Neck: No visible masses     Lungs:   respirations unlabored  Chest Wall:  No tenderness or deformity  Heart:  Regular rate and rhythm,  Abdomen:   Soft, non-tender,         Extremities: RIGHT HAND IN BANDAGE, THUMB WARM WELL PERFUSED GOOD WRIST AND FOREARM ROTATION GOOD ELBOW MOTION  Pulses: 2+ and symmetric  Skin: Skin color, texture, turgor normal, no rashes or lesions     Neurologic: Normal    Assessment/Plan RIGHT LONG/RING/SMALL FINGER CRUSH INJURIES  RIGHT LONG/RING/SMALL FINGER OPEN DEBRIDEMENT AND REPAIR AS INDICATED  R/B/A DISCUSSED WITH PT IN OFFICE.  PT VOICED UNDERSTANDING OF PLAN CONSENT SIGNED DAY OF SURGERY PT SEEN AND EXAMINED PRIOR TO OPERATIVE PROCEDURE/DAY OF SURGERY SITE MARKED. QUESTIONS ANSWERED WILL GO HOME FOLLOWING SURGERY  WE ARE PLANNING SURGERY FOR YOUR UPPER EXTREMITY. THE RISKS AND BENEFITS OF SURGERY INCLUDE BUT NOT LIMITED TO BLEEDING INFECTION, DAMAGE TO NEARBY NERVES ARTERIES TENDONS, FAILURE OF SURGERY TO ACCOMPLISH ITS INTENDED GOALS, PERSISTENT SYMPTOMS AND NEED FOR FURTHER SURGICAL INTERVENTION. WITH THIS IN MIND WE WILL PROCEED. I HAVE DISCUSSED WITH THE PATIENT THE PRE AND POSTOPERATIVE REGIMEN AND THE DOS AND DON'TS. PT VOICED UNDERSTANDING AND INFORMED CONSENT SIGNED.  Linna Hoff 08/16/2015, 4:21 PM

## 2015-08-16 NOTE — Anesthesia Preprocedure Evaluation (Signed)
Anesthesia Evaluation  Patient identified by MRN, date of birth, ID band Patient awake    Reviewed: Allergy & Precautions, NPO status , Patient's Chart, lab work & pertinent test results  Airway Mallampati: II  TM Distance: >3 FB Neck ROM: Full    Dental  (+) Teeth Intact   Pulmonary neg shortness of breath, neg sleep apnea, neg COPD, neg recent URI, former smoker,    breath sounds clear to auscultation       Cardiovascular negative cardio ROS   Rhythm:Regular     Neuro/Psych PSYCHIATRIC DISORDERS Anxiety negative neurological ROS     GI/Hepatic negative GI ROS, Neg liver ROS,   Endo/Other  negative endocrine ROS  Renal/GU negative Renal ROS     Musculoskeletal   Abdominal   Peds  Hematology negative hematology ROS (+)   Anesthesia Other Findings   Reproductive/Obstetrics                             Anesthesia Physical Anesthesia Plan  ASA: II  Anesthesia Plan: General   Post-op Pain Management:    Induction: Intravenous  Airway Management Planned: Oral ETT  Additional Equipment: None  Intra-op Plan:   Post-operative Plan: Extubation in OR  Informed Consent: I have reviewed the patients History and Physical, chart, labs and discussed the procedure including the risks, benefits and alternatives for the proposed anesthesia with the patient or authorized representative who has indicated his/her understanding and acceptance.   Dental advisory given  Plan Discussed with: CRNA and Surgeon  Anesthesia Plan Comments:         Anesthesia Quick Evaluation

## 2015-08-16 NOTE — Discharge Instructions (Signed)
KEEP BANDAGE CLEAN AND DRY CALL OFFICE FOR F/U APPT [phone] IN 13 DAYS KEEP HAND ELEVATED ABOVE HEART OK TO APPLY ICE TO OPERATIVE AREA CONTACT OFFICE IF ANY WORSENING PAIN OR CONCERNS.  

## 2015-08-16 NOTE — ED Notes (Signed)
Pt reports to the ED for eval of right hand injury and his hand got crushed in some machinery. Pt reports injury to 3 fingers. [facility]. Unsure of last tetanus. Not on blood thinners. Bleeding controlled. + 2 radial pulses present. Pt &Ox4, resp e/u, and skin warm and dry.

## 2015-08-16 NOTE — Anesthesia Procedure Notes (Signed)
Procedure Name: Intubation Date/Time: 08/16/2015 4:45 PM Performed by: Orvilla FusATO, Kaliq Lege A Pre-anesthesia Checklist: Patient identified, Timeout performed, Emergency Drugs available, Suction available and Patient being monitored Patient Re-evaluated:Patient Re-evaluated prior to inductionOxygen Delivery Method: Circle system utilized Preoxygenation: Pre-oxygenation with 100% oxygen Intubation Type: IV induction Ventilation: Mask ventilation without difficulty Laryngoscope Size: Mac and 4 Grade View: Grade I Tube type: Oral Tube size: 7.5 mm Number of attempts: 1 Airway Equipment and Method: Stylet Placement Confirmation: ETT inserted through vocal cords under direct vision,  breath sounds checked- equal and bilateral and positive ETCO2 Secured at: 23 cm Tube secured with: Tape Dental Injury: Teeth and Oropharynx as per pre-operative assessment

## 2015-08-16 NOTE — Brief Op Note (Signed)
08/16/2015  4:23 PM  PATIENT:  Kevin Irwin  55 y.o. male  PRE-OPERATIVE DIAGNOSIS:  RIGHT MIDDLE,RING AND SMALL FINGER FXS  POST-OPERATIVE DIAGNOSIS:  * No post-op diagnosis entered *  PROCEDURE:  Procedure(s): DEBRIDE,REPAIR AND PINNING OF RIGHT MIDDLE, RING AND SMALL FINGERS (Right)  SURGEON:  Surgeon(s) and Role:    * Bradly BienenstockFred Sanita Estrada, MD - Primary  PHYSICIAN ASSISTANT:   ASSISTANTS: none   ANESTHESIA:   general  EBL:     BLOOD ADMINISTERED:none  DRAINS: none   LOCAL MEDICATIONS USED:  LIDOCAINE   SPECIMEN:  No Specimen  DISPOSITION OF SPECIMEN:  N/A  COUNTS:  YES  TOURNIQUET:    DICTATION: .Other Dictation: Dictation Number [id number] [id number]  PLAN OF CARE: Discharge to home after PACU  PATIENT DISPOSITION:  PACU - hemodynamically stable.   Delay start of Pharmacological VTE agent (>24hrs) due to surgical blood loss or risk of bleeding: not applicable

## 2015-08-16 NOTE — ED Provider Notes (Signed)
CSN: [id number]     Arrival date & time 08/16/15  1040 History  By signing my name below, I, Sonum Patel, attest that this documentation has been prepared under the direction and in the presence of [employer]. Electronically Signed: Sonum Patel, Neurosurgeon. 08/16/2015. 11:38 AM.     Chief Complaint  Patient presents with  . Hand Injury   The history is provided by the patient. No language interpreter was used.    HPI Comments: Kevin Irwin is a 55 y.o. right-handed male who presents to the Emergency Department complaining of a right hand injury that occurred earlier today. Patient states his right 3rd, 4th, and 5th digits were crushed in valve machinery. He rinsed the affected area with saline solution before going to an Urgent Care; he was sent to the ED for further evaluation. He had brisk oozing after the injury. He is unsure of last tetanus update. He denies any other injuries at this time.   Past Medical History  Diagnosis Date  . Hernia   . Arthritis   . Anxiety   . Substance abuse    Past Surgical History  Procedure Laterality Date  . Hernia repair      twice  . Esophagogastroduodenoscopy N/A 05/14/2013    Procedure: ESOPHAGOGASTRODUODENOSCOPY (EGD);  Surgeon: Petra Kuba, MD;  Location: [facility] ENDOSCOPY;  Service: Endoscopy;  Laterality: N/A;  . Implantation bone anchored hearing aid     Family History  Problem Relation Age of Onset  . Cancer Father    Social History  Substance Use Topics  . Smoking status: Former Smoker    Quit date: 07/03/1993  . Smokeless tobacco: None  . Alcohol Use: 0.0 oz/week    Review of Systems  Constitutional: Negative for fever.  HENT: Negative for rhinorrhea and sore throat.   Eyes: Negative for redness.  Respiratory: Negative for cough.   Cardiovascular: Negative for chest pain.  Gastrointestinal: Negative for nausea, vomiting, abdominal pain and diarrhea.  Genitourinary: Negative for dysuria.  Musculoskeletal: Positive for myalgias  and arthralgias (right digits). Negative for back pain, joint swelling, gait problem and neck pain.  Skin: Positive for wound (lacerations). Negative for rash.  Neurological: Positive for numbness (paresthesias of fingertips). Negative for weakness and headaches.    Allergies  Review of patient's allergies indicates no known allergies.  Home Medications   Prior to Admission medications   Not on File   BP 159/93 mmHg  Pulse 99  Temp(Src) 98 F (36.7 C) (Oral)  Resp 16  SpO2 99%   Physical Exam  Constitutional: He is oriented to person, place, and time. He appears well-developed and well-nourished.  HENT:  Head: Normocephalic and atraumatic.  Eyes: Conjunctivae are normal. Right eye exhibits no discharge. Left eye exhibits no discharge.  Neck: Normal range of motion. Neck supple.  Cardiovascular: Normal rate, regular rhythm and normal heart sounds.   Pulmonary/Chest: Effort normal and breath sounds normal.  Abdominal: Soft. There is no tenderness.  Musculoskeletal:  Long digit: laceration overlying the nail root. Slight angulation of the fingertip. Nail intact. Mild oozing. No FB visualized. Patient is able to flex and extend at the DIP, PIP, and MCP with some pain. Cap refill brisk.  Ring digit: laceration of the dorsum of the finger just distal to the DIP joint. There is another laceration of the volar aspect of finger just distal to the fold of the DIP. Nail intact. Mild oozing. No FB visualized. Patient is able to flex and extend at the  DIP, PIP, and MCP with some pain. Cap refill brisk.   Short digit: laceration of the dorsum of the finger over the DIP joint. There is another laceration of the volar aspect of finger just distal to the fold of the DIP. Nail intact. Mild oozing. No FB visualized. Patient is able to flex and extend at the DIP, PIP, and MCP with some pain. Cap refill brisk.   Neurological: He is alert and oriented to person, place, and time.  Skin: Skin is warm  and dry.  Psychiatric: He has a normal mood and affect.  Nursing note and vitals reviewed.         ED Course  Procedures (including critical care time)  DIAGNOSTIC STUDIES: Oxygen Saturation is 99% on RA, normal by my interpretation.    COORDINATION OF CARE: 11:47 AM Will rinse the right hand in saline solution and perform digital blocks to the affected fingers.  Discussed treatment plan with pt at bedside and pt agreed to plan.  12:06 PM  3-Sided Ring Digital Block  Consent: Verbal consent obtained. Risks and benefits: risks, benefits and alternatives were discussed Patient identity confirmed: provided demographic data  Time out performed prior to procedure  Numbed right 3rd digit with lidocaine 1.5 cc and bupivacaine 1.5 cc  Pt tolerated procedure well without complication.  3-Sided Ring Digital Block  Consent: Verbal consent obtained. Risks and benefits: risks, benefits and alternatives were discussed Patient identity confirmed: provided demographic data  Time out performed prior to procedure  Numbed right 4th digit with lidocaine 1.5 cc and bupivacaine 1.5 cc Pt tolerated procedure well without complication.  3-Sided Ring Digital Block  Consent: Verbal consent obtained. Risks and benefits: risks, benefits and alternatives were discussed Patient identity confirmed: provided demographic data  Time out performed prior to procedure  Numbed right 5th digit with lidocaine 1.5 cc and bupivacaine 1.5 cc Pt tolerated procedure well without complication.   Imaging Review Dg Hand Complete Right  08/16/2015  CLINICAL DATA:  Injury to the third, fourth and fifth fingers at work. Pain. Swelling. EXAM: RIGHT HAND - COMPLETE 3+ VIEW COMPARISON:  None. FINDINGS: Injury to the third finger involves the distal phalanx. There is an oblique displaced fracture with laceration of the soft tissues. Injury to the fourth finger appears to involve both the distal phalanx proximally and the  middle phalanx distally at the DIP joint. These fractures are essentially nondisplaced. Soft tissue laceration is present. Injury to the fifth finger consists of fractures of the distal middle phalanx and proximal distal phalanx, centered at the DIP joint. Soft tissue laceration is also present. Proximal phalanges are intact throughout. The thumb and index finger are intact. No metacarpal injury. No visible radiopaque foreign body. IMPRESSION: Third, fourth and fifth digit injuries as described. Electronically Signed   By: Elsie StainJohn T Curnes M.D.   On: 08/16/2015 11:55   I have personally reviewed and evaluated these images as part of my medical decision-making.  Vital signs reviewed and are as follows: Filed Vitals:   08/16/15 1051  BP: 159/93  Pulse: 99  Temp: 98 F (36.7 C)  Resp: 16   Pt seen. Tetanus ordered. Digital blocks performed and wounds irrigated with 1000cc NS and explored. Images reviewed with Dr. Rubin PayorPickering. I spoke with Dr. Melvyn Novasrtmann who is currently in the OR. He will review images, x-ray, and give recommendations. Asked for patient to be NPO.   Patient and family updated on x-ray results.   1:51 PM Dr. Melvyn Novasrtmann will take patient  to OR. Pre-op labs, EKG ordered. IV Ancef ordered.   MDM   Final diagnoses:  Open fracture of distal phalanx of fifth finger of right hand, initial encounter  Open fracture of distal phalanx of fourth finger of right hand, initial encounter  Open fracture of distal phalanx of third finger of right hand, initial encounter   To OR.   I personally performed the services described in this documentation, which was scribed in my presence. The recorded information has been reviewed and is accurate.   Renne Crigler, PA-C 08/16/15 1403  Benjiman Core, MD 08/16/15 1504

## 2015-08-16 NOTE — Transfer of Care (Signed)
Immediate Anesthesia Transfer of Care Note  Patient: Kevin Irwin  Procedure(s) Performed: Procedure(s): [employer]BRIDE,REPAIR AND PINNING OF RIGHT MIDDLE, RING AND SMALL FINGERS (Right)  Patient Location: PACU  Anesthesia Type:General  Level of Consciousness: awake, alert  and oriented  Airway & Oxygen Therapy: Patient Spontanous Breathing and Patient connected to nasal cannula oxygen  Post-op Assessment: Report given to RN, Post -op Vital signs reviewed and stable and Patient moving all extremities  Post vital signs: Reviewed and stable  Last Vitals:  Filed Vitals:   08/16/15 1051 08/16/15 1816  BP: 159/93   Pulse: 99   Temp: 36.7 C 36.3 C  Resp: 16     Last Pain:  Filed Vitals:   08/16/15 1818  PainSc: 7          Complications: No apparent anesthesia complications

## 2015-08-17 ENCOUNTER — Encounter (HOSPITAL_COMMUNITY): Payer: Self-pay | Admitting: Orthopedic Surgery

## 2015-08-17 NOTE — Op Note (Signed)
NAME:  Kevin Irwin, Kevin Irwin               ACCOUNT NO.:  [id number]  MEDICAL RECORD NO.:  [id number]  LOCATION:  [facility]                         FACILITY:  [facility]  PHYSICIAN:  Sharma CovertFred W. Sulamita Lafountain IV, M.D.DATE OF BIRTH:  May 10, 1960  DATE OF PROCEDURE:  08/16/2015 DATE OF DISCHARGE:  08/16/2015                              OPERATIVE REPORT   PREOPERATIVE DIAGNOSIS:  Right long ring finger and small finger crush injuries.  POSTOPERATIVE DIAGNOSIS:  Right long ring finger and small finger crush injuries.  ATTENDING PHYSICIAN:  Sharma CovertFred W. Quanta Roher, M.D., who was scrubbed and present for the entire procedure.  ASSISTANT SURGEON:  None.  ANESTHESIA:  General via LMA.  SURGICAL PROCEDURES: 1. Right long finger debridement of skin, subcutaneous tissue and bone     associated with open distal phalanx fracture, excisional     debridement. 2. Right long finger nail bed repair. 3. Open treatment of right long finger distal phalanx fracture with     the requiring internal fixation. 4. Repair of traumatic laceration, right long finger, dorsal, 2.5-cm     laceration. 5. Right ring finger open debridement of skin and subcutaneous tissue     associated with open fracture, dislocation of the distal     interphalangeal joint. 6. Repair of right ring finger collateral ligament complex, radial     collateral ligament complex. 7. Right ring finger volar plate repair. 8. Traumatic laceration repair, 2 cm. 9. Right small finger debridement of skin and subcutaneous tissue and     bone associated with open fracture, dislocation of the distal     interphalangeal joint. 10.Right small finger distal interphalangeal joint arthrodesis with     two cross K-wires. 11.Radiographs, 3 views, right hand. 12.Right small finger traumatic laceration repair, 2.5 cm.  SURGICAL INDICATIONS:  Kevin Irwin is a right-hand-dominant gentleman who sustained a crushing injury to his right long, ring and small fingers. The patient was  seen and evaluated and recommended to undergo the above procedure.  Risks, benefits, and alternatives were discussed in detail with the patient and signed informed consent was obtained.  Risks include, but not limited to bleeding, infection, damage to nearby nerves, arteries, or tendons; loss of motion of the wrist and digits; incomplete relief of symptoms and need for further surgical intervention.  DESCRIPTION OF PROCEDURE:  The patient was properly identified in the preoperative holding area and marked with a permanent marker made on the right hand to indicate the correct operative site.  The patient was brought back to the operating room and placed supine on the anesthesia room table where general anesthesia was administered.  The patient tolerated this well.  Preoperative antibiotics were given prior to any skin incision.  A well-padded tourniquet was placed on the right brachium and sealed with 1000-drape.  The right upper extremity was then prepped and draped in normal sterile fashion.  Time-out was called, the correct site was identified and procedure was then begun.  Attention was then turned to the right long finger.  The nail plate was then removed. Open debridement of the skin and subcutaneous tissue was then carried out dorsally.  Excisional debridement was then carried out of the loose bone fragments,  this was done sharply with scissors and knife.  The devitalized tissue was then removed.  The wound was then thoroughly irrigated.  Following this, a 0.045 K-wire was then placed retrograde and then placed antegrade across the distal interphalangeal joint with good purchase, reduced the fracture very nicely.  Following this, the nail matrix was then repaired, the sterile matrix was then repaired with 4-0 chromic suture.  The traumatic laceration was then repaired with a simple Prolene sutures, 2.5-cm laceration.  Pin was then cut and then bent, left out of the skin.  Xeroform  dressing was applied around the pin site.  Attention was then turned to the right ring finger.  Excisional debridement of the skin and subcutaneous tissue was then done on the radial side of the ring finger.  The patient did have the open fracture, dislocation of the DIP joint and the joint was then thoroughly irrigated.  The joint was then reduced.  Collateral ligament volar plate repair was then done along the radial side, which held the joint nicely, aligned.  This was done with an Ethibond suture.  Once this was carried out of the collateral ligament volar plate repair, the skin was then closed using Prolene sutures, traumatic laceration with 2.5 cm.  The neurovascular bundles were intact to the ring finger and the FDP insertion looked good.  Attention was then turned to the small finger.  The small finger had the most injury, near circumferential, near amputation of the finger.  The neurovascular bundles were in continuity.  FDP was in continuity; however, unfortunately, the joint was significantly damaged with near complete loss of the cartilaginous surface of the middle phalanx as well as to the distal phalanx.  Several loose large articular pieces from the middle phalanx and distal phalanx were then removed.  Bone was taken back to cancellous bone on both surfaces and the bone ends coapted together and a primary DIP fusion was done with a 0.045 and 0.035 K- wires, decided to fuse the finger and keep the length of the finger not amputated with a good perfusion and neuro-sensate finger distally.  The wound was then irrigated.  The traumatic laceration was then repaired after excisional debridement was done sharply with the scissors and the knife and with Prolene sutures, 3-cm laceration.  A 10 mL of 0.25% Marcaine infiltrated between the three fingers.  Adaptic dressing and sterile compressive bandages were then applied.  The patient tolerated the procedure well and returned to  the recovery room in good condition.  INTRAOPERATIVE RADIOGRAPHS:  Three views of the hand did show the K-wire fixation and good position in all planes.  PLAN:  The patient will be discharged to home, seen back in the office tomorrow for wound check, sent down to see a therapist for tip protector splint and then follow him closely in the office over the next 2 weeks for suture removal around the 2-week mark, pins in for the long finger for 4 weeks, buried K-wires for the small finger for 8-10 weeks. Radiographs at each visit.     Madelynn Done, M.D.     FWO/MEDQ  D:  08/16/2015  T:  08/17/2015  Job:  [id number]

## 2015-08-18 NOTE — Anesthesia Postprocedure Evaluation (Signed)
Anesthesia Post Note  Patient: Kevin Irwin  Procedure(s) Performed: Procedure(s) (LRB): DEBRIDE,REPAIR AND PINNING OF RIGHT MIDDLE, RING AND SMALL FINGERS (Right)  Patient location during evaluation: PACU Anesthesia Type: General Level of consciousness: awake Pain management: pain level controlled Vital Signs Assessment: post-procedure vital signs reviewed and stable Respiratory status: spontaneous breathing Cardiovascular status: stable Postop Assessment: no signs of nausea or vomiting Anesthetic complications: no    Last Vitals:  Filed Vitals:   08/16/15 1837 08/16/15 1845  BP: 132/90 142/92  Pulse: 90 79  Temp:  36.2 C  Resp: 15 12    Last Pain:  Filed Vitals:   08/18/15 1053  PainSc: 5                  Sherron Mapp

## 2017-02-08 ENCOUNTER — Emergency Department (HOSPITAL_COMMUNITY): Payer: No Typology Code available for payment source

## 2017-02-08 ENCOUNTER — Emergency Department (HOSPITAL_COMMUNITY)
Admission: EM | Admit: 2017-02-08 | Discharge: 2017-02-08 | Disposition: A | Discharge status: Home / Self Care | Payer: No Typology Code available for payment source | Attending: Emergency Medicine | Admitting: Emergency Medicine

## 2017-02-08 ENCOUNTER — Encounter (HOSPITAL_COMMUNITY): Payer: Self-pay | Admitting: *Deleted

## 2017-02-08 DIAGNOSIS — Y93H2 Activity, gardening and landscaping: Secondary | ICD-10-CM | POA: Diagnosis not present

## 2017-02-08 DIAGNOSIS — Y998 Other external cause status: Secondary | ICD-10-CM | POA: Insufficient documentation

## 2017-02-08 DIAGNOSIS — Z87891 Personal history of nicotine dependence: Secondary | ICD-10-CM | POA: Diagnosis not present

## 2017-02-08 DIAGNOSIS — Z79899 Other long term (current) drug therapy: Secondary | ICD-10-CM | POA: Diagnosis not present

## 2017-02-08 DIAGNOSIS — X509XXA Other and unspecified overexertion or strenuous movements or postures, initial encounter: Secondary | ICD-10-CM | POA: Diagnosis not present

## 2017-02-08 DIAGNOSIS — Y92017 Garden or yard in single-family (private) house as the place of occurrence of the external cause: Secondary | ICD-10-CM | POA: Insufficient documentation

## 2017-02-08 DIAGNOSIS — S8262XA Displaced fracture of lateral malleolus of left fibula, initial encounter for closed fracture: Secondary | ICD-10-CM

## 2017-02-08 DIAGNOSIS — S99912A Unspecified injury of left ankle, initial encounter: Secondary | ICD-10-CM | POA: Diagnosis present

## 2017-02-08 NOTE — ED Notes (Signed)
Declined W/C at D/C and was escorted to lobby by RN. 
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
## 2017-02-08 NOTE — ED Provider Notes (Signed)
[facility] Provider Note   CSN: [id number] Arrival date & time: 02/08/17  1426     History   Chief Complaint Chief Complaint  Patient presents with  . Ankle Pain    HPI Norberta KeensFoster Gargus is a 56 y.o. male presents to ED for evaluation of left ankle pain and swelling after injury that occurred prior to arrival. He states that he was working in his yard when he accidentally rolled his left ankle. He does have a history of similar symptoms in the past but denies any previous surgery in the area. He denies any head injury, loss of consciousness, fever, or other symptoms. He has tried taking ibuprofen at home with mild relief in symptoms. He has not tried ambulating on the extremity since the incident occurred.  HPI  Past Medical History:  Diagnosis Date  . Anxiety   . Arthritis   . Hernia   . Substance abuse (HCC)     There are no active problems to display for this patient.   Past Surgical History:  Procedure Laterality Date  . ESOPHAGOGASTRODUODENOSCOPY N/A 05/14/2013   Procedure: ESOPHAGOGASTRODUODENOSCOPY (EGD);  Surgeon: Petra KubaMarc E Magod, MD;  [facility];  Service: Endoscopy;  Laterality: N/A;  . HERNIA REPAIR     twice  . IMPLANTATION BONE ANCHORED HEARING AID    . NERVE, TENDON AND ARTERY REPAIR Right 08/16/2015   [facility] RIGHT MIDDLE, RING AND SMALL FINGERS;  Surgeon: Bradly BienenstockFred Ortmann, MD;  Location: [facility] OR;  Service: Orthopedics;  Laterality: Right;       Home Medications    Prior to Admission medications   Medication Sig Start Date End Date Taking? Authorizing Provider  cephALEXin (KEFLEX) 500 MG capsule Take 1 capsule (500 mg total) by mouth 4 (four) times daily. 08/16/15   Bradly Bienenstockrtmann, Fred, MD  docusate sodium (COLACE) 100 MG capsule Take 1 capsule (100 mg total) by mouth 2 (two) times daily. 08/16/15   Bradly Bienenstockrtmann, Fred, MD  ibuprofen (ADVIL,MOTRIN) 200 MG tablet Take 400 mg by mouth every 6 (six)  hours as needed for mild pain.    [provider]  oxyCODONE-acetaminophen (ROXICET) 5-325 MG tablet Take 1 tablet by mouth every 4 (four) hours as needed for severe pain. 08/16/15   Bradly Bienenstockrtmann, Fred, MD    Family History Family History  Problem Relation Age of Onset  . Cancer Father     Social History Social History  Substance Use Topics  . Smoking status: Former Smoker    Quit date: 07/03/1993  . Smokeless tobacco: Never Used  . Alcohol use 0.0 oz/week     Allergies   Patient has no known allergies.   Review of Systems Review of Systems  Constitutional: Negative for fever.  Gastrointestinal: Negative for nausea and vomiting.  Musculoskeletal: Positive for arthralgias and joint swelling. Negative for back pain, gait problem and myalgias.  Skin: Negative for rash.  Neurological: Negative for weakness and numbness.     Physical Exam Updated Vital Signs BP (!) 131/92 (BP Location: Right Arm)   Pulse (!) 103   Temp 97.6 F (36.4 C) (Oral)   Resp 17   Ht 5\' 11"  (1.803 m)   Wt 88.5 kg (195 lb)   SpO2 98%   BMI 27.20 kg/m   Physical Exam  Constitutional: He appears well-developed and well-nourished. No distress.  HENT:  Head: Normocephalic and atraumatic.  Eyes: Conjunctivae and EOM are normal. No scleral icterus.  Neck: Normal range of  motion.  Pulmonary/Chest: Effort normal. No respiratory distress.  Musculoskeletal: Normal range of motion. He exhibits edema and tenderness. He exhibits no deformity.       Feet:  Significant edema noted along the lateral malleolus of the left ankle. Mild tenderness to palpation. No color or temperature changes noted. Full active and passive range of motion of left ankle and digits. 2+ DP pulse noted. Sensation intact to light touch in bilateral lower extremities.  Neurological: He is alert.  Skin: No rash noted. He is not diaphoretic.  Psychiatric: He has a normal mood and affect.  Nursing note and vitals reviewed.    ED  Treatments / Results  Labs (all labs ordered are listed, but only abnormal results are displayed) Labs Reviewed - No data to display  EKG  EKG Interpretation None       Radiology Dg Ankle Complete Left  Result Date: 02/08/2017 CLINICAL DATA:  Ankle injury EXAM: LEFT ANKLE COMPLETE - 3+ VIEW COMPARISON:  None. FINDINGS: Slightly displaced fracture at the inferior margin of the lateral malleolus. Distal tibia appears intact and normally aligned. Ankle mortise is symmetric. Soft tissue swelling overlying the lateral malleolus. Visualized portions of the hindfoot and midfoot appear intact and normally aligned. IMPRESSION: Slightly displaced fracture at the inferior margin of the lateral malleolus. Associated soft tissue swelling. Electronically Signed   By: Bary Richard M.D.   On: 02/08/2017 15:24    Procedures Procedures (including critical care time)  Medications Ordered in ED Medications - No data to display   Initial Impression / Assessment and Plan / ED Course  I have reviewed the triage vital signs and the nursing notes.  Pertinent labs & imaging results that were available during my care of the patient were reviewed by me and considered in my medical decision making (see chart for details).     Patient presents to ED for evaluation of left ankle injury after incident that occurred prior to arrival. He was walking when the accident he rolled his left ankle. Physical exam there is no notable edema around the lateral malleolus of the left ankle with no visible color or temperature change noted. He has full active and passive range of motion of the ankle. Sensation intact to light touch. X-ray shows slightly displaced fracture of the inferior margin of the lateral malleolus of the left ankle. We'll place in splint and advised follow-up with orthopedic for further evaluation. Patient has crutches at home which she states use. He declines pain medication at this time and states that he  will take ibuprofen at home as needed. Patient appears stable for discharge at this time. Strict return precautions given.   Final Clinical Impressions(s) / ED Diagnoses   Final diagnoses:  Closed displaced fracture of lateral malleolus of left fibula, initial encounter    New Prescriptions New Prescriptions   No medications on file     Dietrich Pates, PA-C 02/08/17 1611    Dione Booze, MD 02/09/17 [phone]

## 2017-02-08 NOTE — Progress Notes (Signed)
Orthopedic Tech Progress Note Patient Details:  Norberta KeensFoster Blanton 02/12/1961 [id number]  Ortho Devices Type of Ortho Device: Ace wrap, Post (short leg) splint Ortho Device/Splint Location: LLE Ortho Device/Splint Interventions: Ordered, Application   Jennye MoccasinHughes, Kasra Melvin Craig 02/08/2017, 4:07 PM

## 2017-02-08 NOTE — Discharge Instructions (Signed)
Please read attached information regarding your condition and splint care. Do not bear weight on the extremity until evaluated by orthopedist. Take ibuprofen and Tylenol as needed for pain. Follow-up with orthopedist for further evaluation. Return to ED for worsening pain, increased joint swelling or redness, fevers, additional injury or falls.

## 2018-06-11 IMAGING — DX DG ANKLE COMPLETE 3+V*L*
3 series · 3 of 3 positions shown · non-contrast
Comparison: None.

CLINICAL DATA: Ankle injury

EXAM:
LEFT ANKLE COMPLETE - 3+ VIEW

[ankle ap]
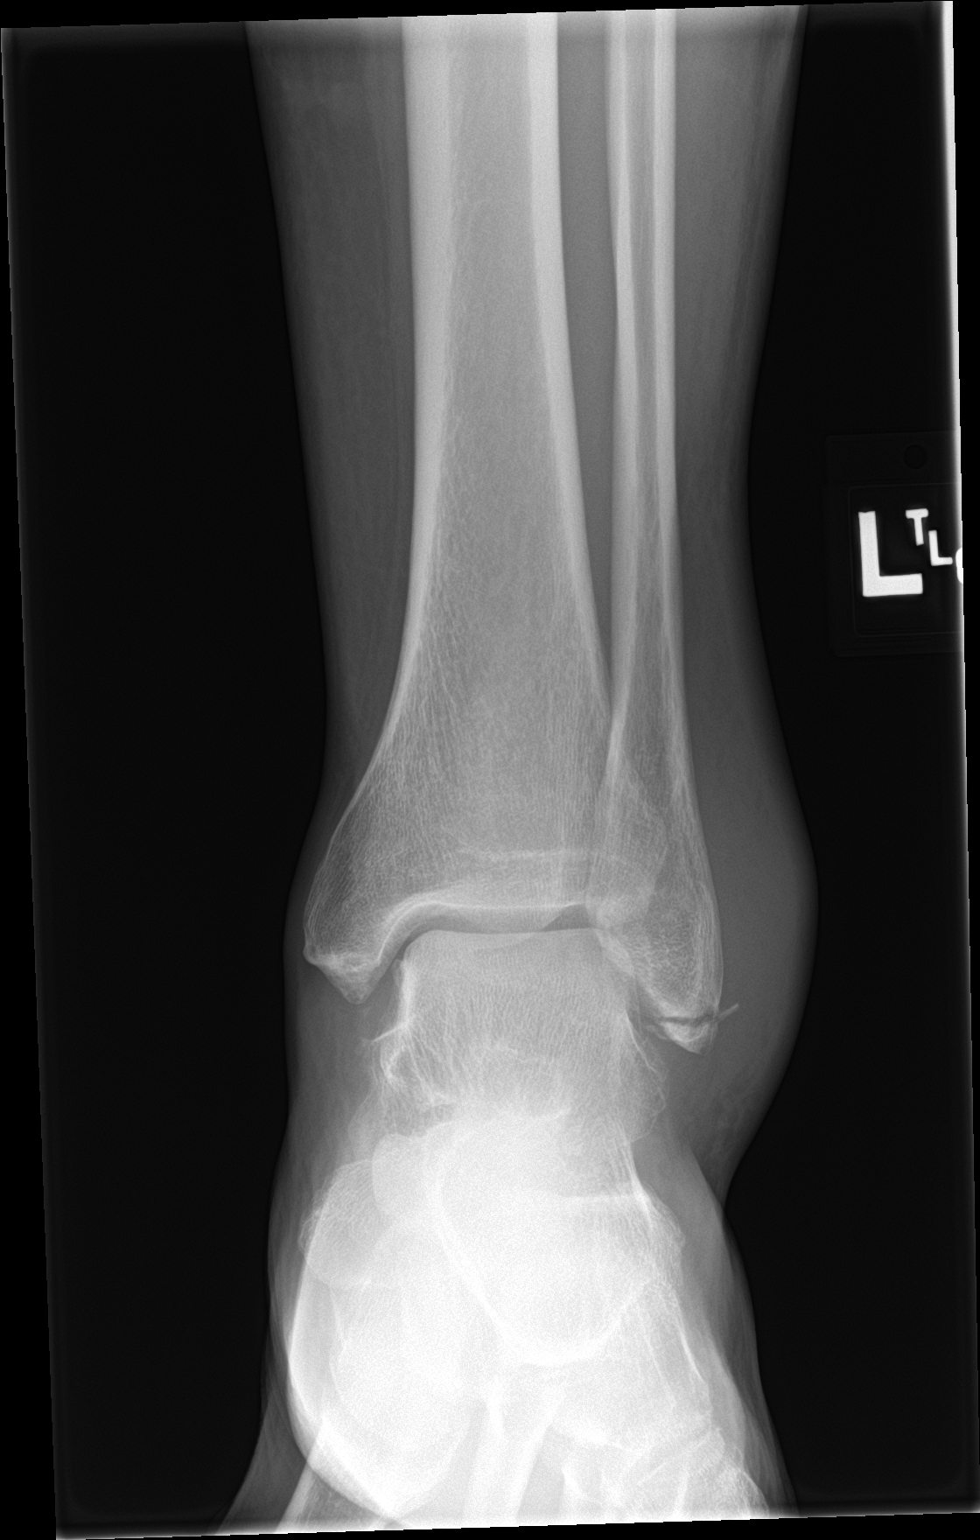

[ankle obl]
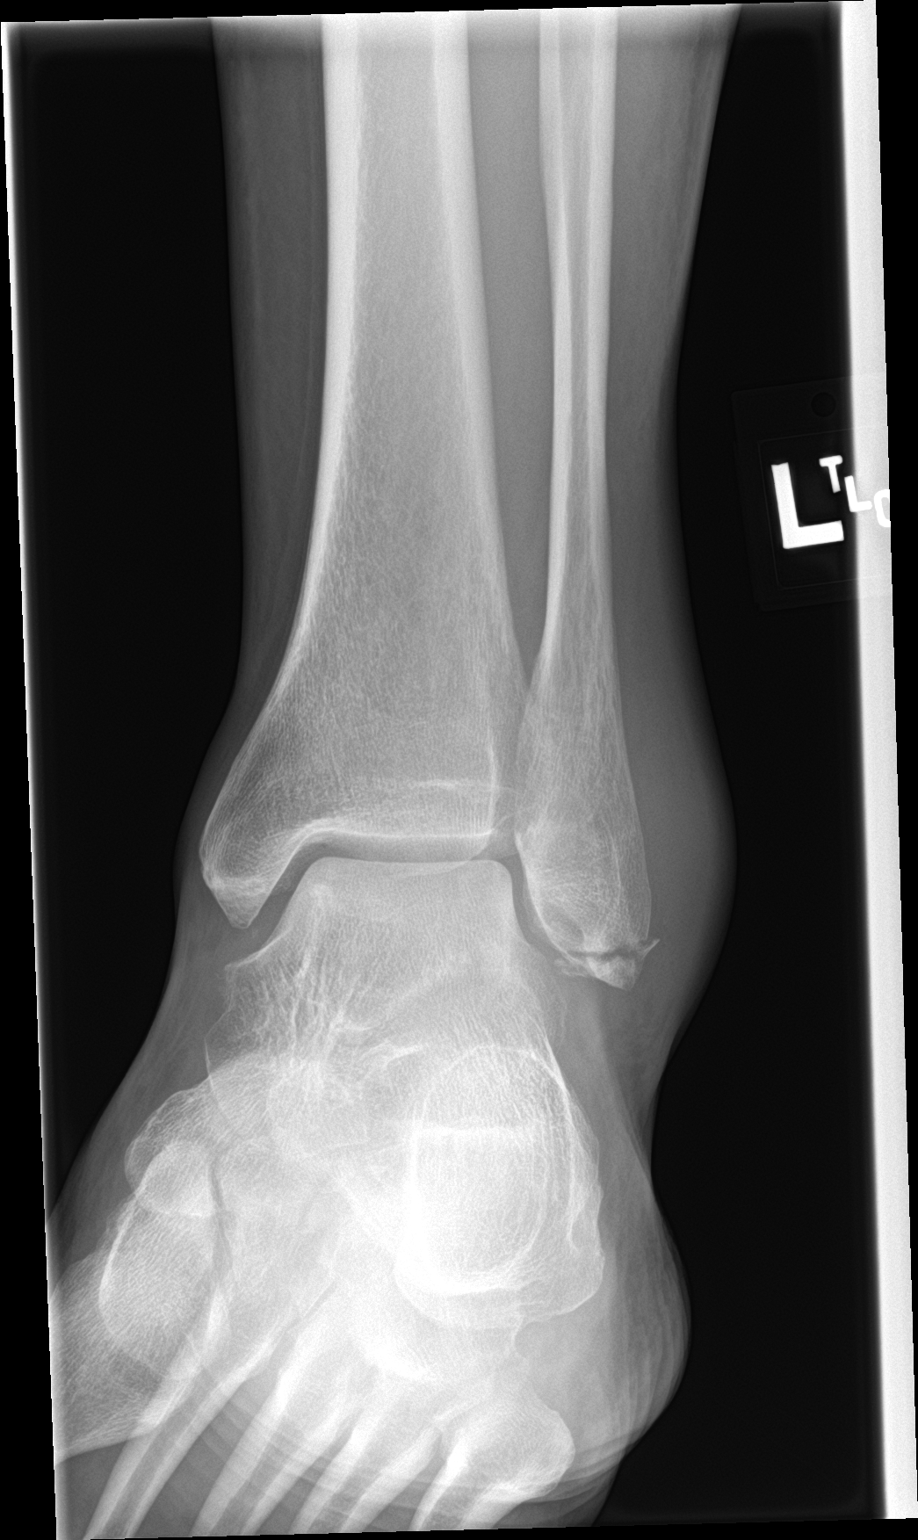

[ankle lat]
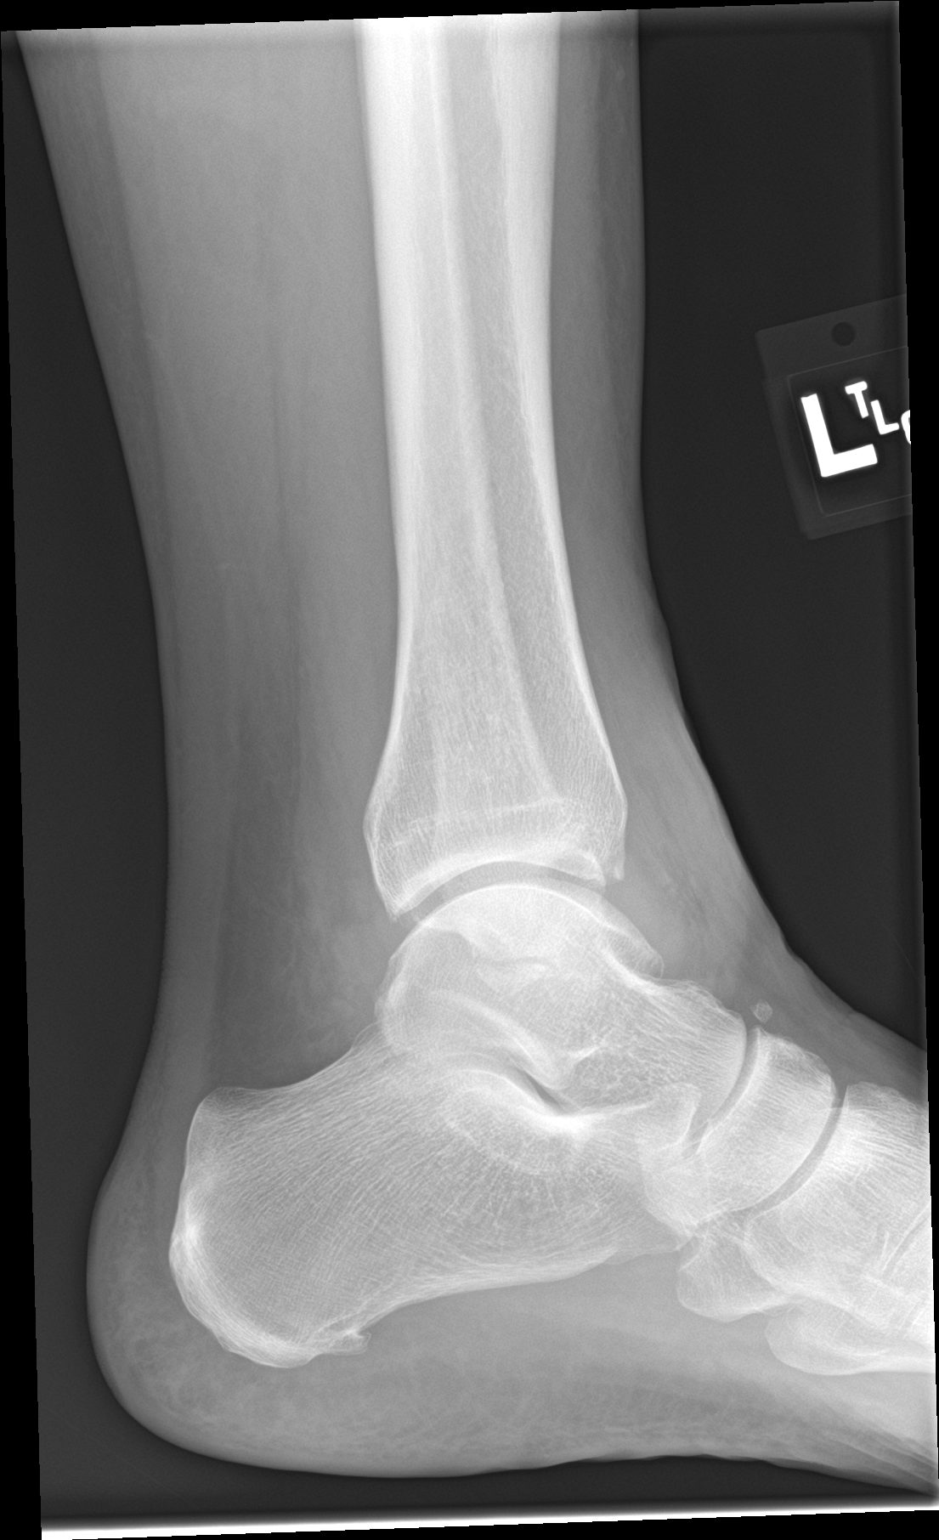

[3 of 3 positions shown; findings below may reference images not displayed]

FINDINGS: Slightly displaced fracture at the inferior margin of the lateral
malleolus. Distal tibia appears intact and normally aligned. Ankle
mortise is symmetric. Soft tissue swelling overlying the lateral
malleolus. Visualized portions of the hindfoot and midfoot appear
intact and normally aligned.
IMPRESSION: Slightly displaced fracture at the inferior margin of the lateral
malleolus. Associated soft tissue swelling.
